# Patient Record
Sex: Female | Born: 2000 | Hispanic: Yes | Marital: Single | Smoking: Never smoker
Health system: Southern US, Community
[De-identification: ages and names within clinical notes are randomized; demographics above are authoritative.]

## PROBLEM LIST (undated history)

## (undated) DIAGNOSIS — F32A Depression, unspecified: Secondary | ICD-10-CM

## (undated) DIAGNOSIS — F419 Anxiety disorder, unspecified: Secondary | ICD-10-CM

## (undated) DIAGNOSIS — F329 Major depressive disorder, single episode, unspecified: Secondary | ICD-10-CM

---
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
## 2023-08-22 ENCOUNTER — Other Ambulatory Visit: Payer: Self-pay

## 2023-08-22 ENCOUNTER — Encounter: Payer: Self-pay | Admitting: Emergency Medicine

## 2023-08-22 ENCOUNTER — Emergency Department
Admission: EM | Admit: 2023-08-22 | Discharge: 2023-08-22 | Disposition: A | Discharge status: Home / Self Care | Payer: Self-pay | Attending: Emergency Medicine | Admitting: Emergency Medicine

## 2023-08-22 DIAGNOSIS — R030 Elevated blood-pressure reading, without diagnosis of hypertension: Secondary | ICD-10-CM | POA: Insufficient documentation

## 2023-08-22 DIAGNOSIS — J069 Acute upper respiratory infection, unspecified: Secondary | ICD-10-CM | POA: Insufficient documentation

## 2023-08-22 NOTE — ED Provider Notes (Signed)
   [facility] Provider Note    Event Date/Time   First MD Initiated Contact with Patient 08/22/23 1916     (approximate)   History   Letter for School/Work   HPI  Calianna Asfour is a 22 y.o. female MH of anxiety and depression presents for a note for school.  Patient is not feeling well yesterday and describes upper respiratory symptoms.  She has had an improvement in her symptoms today but reports that she needs a note in order to return to school.  She does not want testing at this time.      Physical Exam   Triage Vital Signs: ED Triage Vitals [08/22/23 1751]  Encounter Vitals Group     BP (!) 131/92     Systolic BP Percentile      Diastolic BP Percentile      Pulse Rate 91     Resp 18     Temp 98.8 F (37.1 C)     Temp Source Oral     SpO2 98 %     Weight 160 lb (72.6 kg)     Height 5\' 7"  (1.702 m)     Head Circumference      Peak Flow      Pain Score 7     Pain Loc      Pain Education      Exclude from Growth Chart     Most recent vital signs: Vitals:   08/22/23 1751  BP: (!) 131/92  Pulse: 91  Resp: 18  Temp: 98.8 F (37.1 C)  SpO2: 98%    General: Awake, no distress.  CV:  Good peripheral perfusion.  RRR. Resp:  Normal effort.  CTAB. Abd:  No distention.  Other:  Oral mucous membranes are moist, no pharyngeal erythema, no tonsillar enlargement or exudates, no lymphadenopathy.   ED Results / Procedures / Treatments   Labs (all labs ordered are listed, but only abnormal results are displayed) Labs Reviewed - No data to display     PROCEDURES:  Critical Care performed: No  Procedures   MEDICATIONS ORDERED IN ED: Medications - No data to display   IMPRESSION / MDM / ASSESSMENT AND PLAN / ED COURSE  I reviewed the triage vital signs and the nursing notes.                             22 year old female with URI symptoms presents for school note.  Vital signs stable aside from mildly elevated blood pressure.   Patient NAD on exam.  Differential diagnosis includes, but is not limited to, school note.  Patient's presentation is most consistent with acute, uncomplicated illness.  Patient was advised to continue over-the-counter cold medications as needed for her symptoms.  She was given a note for school.  She was stable at discharge.      FINAL CLINICAL IMPRESSION(S) / ED DIAGNOSES   Final diagnoses:  Viral upper respiratory tract infection     Rx / DC Orders   ED Discharge Orders     None        Note:  This document was prepared using Dragon voice recognition software and may include unintentional dictation errors.   Cameron Ali, PA-C 08/22/23 1951    Minna Antis, MD 08/22/23 2228

## 2023-08-22 NOTE — ED Notes (Signed)
Pt reports being sick over the weekend and feeling better now. Requesting a note to return to class.

## 2023-08-22 NOTE — Discharge Instructions (Signed)
Take cold medication as needed for symptoms.

## 2023-08-22 NOTE — ED Triage Notes (Signed)
Patient to ED via POV for school note. States she missed school yesterday and today. States she has a cough, fever and headache. Pt refusing testing at this time and just requesting note. NAD noted in triage.

## 2023-08-22 NOTE — ED Notes (Signed)
Pt verbalizes understanding of discharge instructions. Opportunity for questioning and answers were provided. Pt discharged from ED to home.   ? ?
